# Patient Record
Sex: Male | Born: 1980 | Race: Black or African American | Hispanic: No | Marital: Single | Smoking: Current some day smoker
Health system: Southern US, Community
[De-identification: ages and names within clinical notes are randomized; demographics above are authoritative.]

---

## 2018-11-14 ENCOUNTER — Other Ambulatory Visit: Payer: Self-pay

## 2018-11-14 ENCOUNTER — Encounter: Payer: Self-pay | Admitting: Emergency Medicine

## 2018-11-14 ENCOUNTER — Emergency Department
Admission: EM | Admit: 2018-11-14 | Discharge: 2018-11-14 | Disposition: A | Discharge status: Home / Self Care | Payer: Self-pay | Attending: Emergency Medicine | Admitting: Emergency Medicine

## 2018-11-14 ENCOUNTER — Emergency Department: Payer: Self-pay

## 2018-11-14 DIAGNOSIS — R05 Cough: Secondary | ICD-10-CM

## 2018-11-14 DIAGNOSIS — B349 Viral infection, unspecified: Secondary | ICD-10-CM | POA: Insufficient documentation

## 2018-11-14 DIAGNOSIS — R059 Cough, unspecified: Secondary | ICD-10-CM

## 2018-11-14 NOTE — ED Provider Notes (Signed)
[facility] Emergency Department Provider Note  ____________________________________________  Time seen: Approximately 4:13 PM  I have reviewed the triage vital signs and the nursing notes.   HISTORY  Chief Complaint Cough and Generalized Body Aches    HPI Barry Norman is a 38 y.o. male presents to the emergency department with rhinorrhea, low-grade fever and generalized body aches that patient states has resolved. No cough and rash.  Patient is trying to go back to work and would like a return to work note.  He did state that he had chills last night.  He denies chest tightness, chest pain or shortness of breath.  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  He has had some mild diarrhea.  No vomiting.  No recent travel outside of the country.  Patient has been taking Tylenol for fever but is attempted no other alleviating measures.        History reviewed. No pertinent past medical history.  There are no active problems to display for this patient.   History reviewed. No pertinent surgical history.  Prior to Admission medications   Not on File    Allergies Patient has no known allergies.  No family history on file.  Social History Social History   Tobacco Use  . Smoking status: Not on file  Substance Use Topics  . Alcohol use: Not on file  . Drug use: Not on file     Review of Systems  Constitutional: Patient has low grade fever.  Eyes: No visual changes. No discharge ENT: Patient has nasal congestion.  Cardiovascular: no chest pain. Respiratory: no cough. No SOB. Gastrointestinal: No abdominal pain.  No nausea, no vomiting.  No diarrhea.  No constipation. Genitourinary: Negative for dysuria. No hematuria  Musculoskeletal: Negative for musculoskeletal pain. Skin: Negative for rash, abrasions, lacerations, ecchymosis. Neurological: Negative for headaches, focal weakness or  numbness.   ____________________________________________   PHYSICAL EXAM:  VITAL SIGNS: ED Triage Vitals  Enc Vitals Group     BP 11/14/18 1528 115/74     Pulse Rate 11/14/18 1528 80     Resp 11/14/18 1528 18     Temp 11/14/18 1528 98.7 F (37.1 C)     Temp Source 11/14/18 1528 Oral     SpO2 11/14/18 1528 97 %     Weight 11/14/18 1529 170 lb (77.1 kg)     Height 11/14/18 1529 6\' 1"  (1.854 m)     Head Circumference --      Peak Flow --      Pain Score 11/14/18 1529 8     Pain Loc --      Pain Edu? --      Excl. in GC? --     Constitutional: Alert and oriented. Patient is lying supine. Eyes: Conjunctivae are normal. PERRL. EOMI. Head: Atraumatic. ENT:      Ears: Tympanic membranes are mildly injected with mild effusion bilaterally.       Nose: No congestion/rhinnorhea.      Mouth/Throat: Mucous membranes are moist. Posterior pharynx is mildly erythematous.  Hematological/Lymphatic/Immunilogical: No cervical lymphadenopathy.  Cardiovascular: Normal rate, regular rhythm. Normal S1 and S2.  Good peripheral circulation. Respiratory: Normal respiratory effort without tachypnea or retractions. Lungs CTAB. Good air entry to the bases with no decreased or absent breath sounds. Gastrointestinal: Bowel sounds 4 quadrants. Soft and nontender to palpation. No guarding or rigidity. No palpable masses. No distention. No CVA tenderness. Musculoskeletal: Full range of motion to all extremities. No gross deformities  appreciated. Neurologic:  Normal speech and language. No gross focal neurologic deficits are appreciated.  Skin:  Skin is warm, dry and intact. No rash noted. Psychiatric: Mood and affect are normal. Speech and behavior are normal. Patient exhibits appropriate insight and judgement.     ____________________________________________   LABS (all labs ordered are listed, but only abnormal results are displayed)  Labs Reviewed - No data to  display ____________________________________________  EKG   ____________________________________________  RADIOLOGY    No results found.  ____________________________________________    PROCEDURES  Procedure(s) performed:    Procedures    Medications - No data to display   ____________________________________________   INITIAL IMPRESSION / ASSESSMENT AND PLAN / ED COURSE  Pertinent labs & imaging results that were available during my care of the patient were reviewed by me and considered in my medical decision making (see chart for details).  Review of the [place] CSRS was performed in accordance of the [facility] prior to dispensing any controlled drugs.           Assessment and plan Viral illness Patient presents to the emergency department with low-grade fever, generalized body aches and mild diarrhea that have since resolved.  Patient reports that he had chills last night.  I advised patient to quarantine himself at home for the next 7 days.  Patient education regarding supportive measures were given.  Patient was not given a return to work note.  Patient was written out of work for the next 7 days.  All patient questions were answered.    ____________________________________________  FINAL CLINICAL IMPRESSION(S) / ED DIAGNOSES  Final diagnoses:  Viral illness      NEW MEDICATIONS STARTED DURING THIS VISIT:  ED Discharge Orders    None          This chart was dictated using voice recognition software/Dragon. Despite best efforts to proofread, errors can occur which can change the meaning. Any change was purely unintentional.    Orvil Feil, PA-C 11/14/18 1624    Phineas Semen, MD 11/14/18 1710

## 2018-11-14 NOTE — ED Triage Notes (Signed)
Cough and generalized body aches x 2 days. Denies having had the flu vaccine.

## 2019-01-01 ENCOUNTER — Encounter: Payer: Self-pay | Admitting: Emergency Medicine

## 2019-01-01 ENCOUNTER — Emergency Department
Admission: EM | Admit: 2019-01-01 | Discharge: 2019-01-02 | Payer: Self-pay | Attending: Emergency Medicine | Admitting: Emergency Medicine

## 2019-01-01 ENCOUNTER — Emergency Department: Payer: Self-pay

## 2019-01-01 DIAGNOSIS — S41131A Puncture wound without foreign body of right upper arm, initial encounter: Secondary | ICD-10-CM | POA: Insufficient documentation

## 2019-01-01 DIAGNOSIS — S41111A Laceration without foreign body of right upper arm, initial encounter: Secondary | ICD-10-CM

## 2019-01-01 DIAGNOSIS — Y999 Unspecified external cause status: Secondary | ICD-10-CM | POA: Insufficient documentation

## 2019-01-01 DIAGNOSIS — S21331A Puncture wound without foreign body of right front wall of thorax with penetration into thoracic cavity, initial encounter: Secondary | ICD-10-CM | POA: Insufficient documentation

## 2019-01-01 DIAGNOSIS — F17200 Nicotine dependence, unspecified, uncomplicated: Secondary | ICD-10-CM | POA: Insufficient documentation

## 2019-01-01 DIAGNOSIS — S21111A Laceration without foreign body of right front wall of thorax without penetration into thoracic cavity, initial encounter: Secondary | ICD-10-CM

## 2019-01-01 DIAGNOSIS — Y929 Unspecified place or not applicable: Secondary | ICD-10-CM | POA: Insufficient documentation

## 2019-01-01 DIAGNOSIS — Z23 Encounter for immunization: Secondary | ICD-10-CM | POA: Insufficient documentation

## 2019-01-01 DIAGNOSIS — S270XXA Traumatic pneumothorax, initial encounter: Secondary | ICD-10-CM | POA: Insufficient documentation

## 2019-01-01 DIAGNOSIS — Y939 Activity, unspecified: Secondary | ICD-10-CM | POA: Insufficient documentation

## 2019-01-01 MED ORDER — SODIUM CHLORIDE 0.9 % IV BOLUS
1000.0000 mL | Freq: Once | INTRAVENOUS | Status: AC
  Administered 2019-01-02: 1000 mL via INTRAVENOUS

## 2019-01-01 MED ORDER — MORPHINE SULFATE (PF) 4 MG/ML IV SOLN
4.0000 mg | Freq: Once | INTRAVENOUS | Status: AC
  Administered 2019-01-02: 4 mg via INTRAVENOUS
  Filled 2019-01-01: qty 1

## 2019-01-01 MED ORDER — ONDANSETRON HCL 4 MG/2ML IJ SOLN
4.0000 mg | Freq: Once | INTRAMUSCULAR | Status: AC
  Administered 2019-01-02: 4 mg via INTRAVENOUS
  Filled 2019-01-01: qty 2
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
## 2019-01-01 NOTE — ED Notes (Signed)
X-ray at bedside

## 2019-01-02 ENCOUNTER — Emergency Department: Payer: Self-pay

## 2019-01-02 ENCOUNTER — Encounter: Payer: Self-pay | Admitting: Radiology

## 2019-01-02 ENCOUNTER — Ambulatory Visit (HOSPITAL_COMMUNITY)
Admission: AD | Admit: 2019-01-02 | Discharge: 2019-01-02 | Disposition: A | Discharge status: Home / Self Care | Payer: Self-pay | Source: Other Acute Inpatient Hospital | Attending: Emergency Medicine | Admitting: Emergency Medicine

## 2019-01-02 DIAGNOSIS — T1490XA Injury, unspecified, initial encounter: Secondary | ICD-10-CM | POA: Insufficient documentation

## 2019-01-02 DIAGNOSIS — X58XXXA Exposure to other specified factors, initial encounter: Secondary | ICD-10-CM | POA: Insufficient documentation

## 2019-01-02 LAB — PROTIME-INR
INR: 1 (ref 0.8–1.2)
Prothrombin Time: 12.9 seconds (ref 11.4–15.2)

## 2019-01-02 LAB — COMPREHENSIVE METABOLIC PANEL
ALT: 11 U/L (ref 0–44)
AST: 16 U/L (ref 15–41)
Albumin: 4.4 g/dL (ref 3.5–5.0)
Alkaline Phosphatase: 62 U/L (ref 38–126)
Anion gap: 12 (ref 5–15)
BUN: 17 mg/dL (ref 6–20)
CO2: 22 mmol/L (ref 22–32)
Calcium: 8.9 mg/dL (ref 8.9–10.3)
Chloride: 108 mmol/L (ref 98–111)
Creatinine, Ser: 1.31 mg/dL — ABNORMAL HIGH (ref 0.61–1.24)
GFR calc Af Amer: >60 mL/min (ref >60)
GFR calc non Af Amer: >60 mL/min (ref >60)
Glucose, Bld: 111 mg/dL — ABNORMAL HIGH (ref 70–99)
Potassium: 3.2 mmol/L — ABNORMAL LOW (ref 3.5–5.1)
Sodium: 142 mmol/L (ref 135–145)
Total Bilirubin: 0.4 mg/dL (ref 0.3–1.2)
Total Protein: 7.8 g/dL (ref 6.5–8.1)

## 2019-01-02 LAB — TYPE AND SCREEN
ABO/RH(D): A POS
Antibody Screen: NEGATIVE

## 2019-01-02 LAB — CBC
HCT: 41.5 % (ref 39.0–52.0)
Hemoglobin: 13.7 g/dL (ref 13.0–17.0)
MCH: 23.5 pg — ABNORMAL LOW (ref 26.0–34.0)
MCHC: 33 g/dL (ref 30.0–36.0)
MCV: 71.3 fL — ABNORMAL LOW (ref 80.0–100.0)
Platelets: 303 10*3/uL (ref 150–400)
RBC: 5.82 MIL/uL — ABNORMAL HIGH (ref 4.22–5.81)
RDW: 16.5 % — ABNORMAL HIGH (ref 11.5–15.5)
WBC: 5.5 10*3/uL (ref 4.0–10.5)
nRBC: 0 % (ref 0.0–0.2)

## 2019-01-02 MED ORDER — LIDOCAINE HCL (PF) 1 % IJ SOLN
INTRAMUSCULAR | Status: AC
  Administered 2019-01-02: 5 mL via INTRADERMAL
  Filled 2019-01-02: qty 10

## 2019-01-02 MED ORDER — ETOMIDATE 2 MG/ML IV SOLN
INTRAVENOUS | Status: AC
  Administered 2019-01-02: 10 mg via INTRAVENOUS
  Filled 2019-01-02: qty 10

## 2019-01-02 MED ORDER — ETOMIDATE 2 MG/ML IV SOLN
5.0000 mg | Freq: Once | INTRAVENOUS | Status: AC
  Administered 2019-01-02: 5 mg via INTRAVENOUS

## 2019-01-02 MED ORDER — IOHEXOL 300 MG/ML  SOLN
75.0000 mL | Freq: Once | INTRAMUSCULAR | Status: AC | PRN
  Administered 2019-01-02: 75 mL via INTRAVENOUS

## 2019-01-02 MED ORDER — MORPHINE SULFATE (PF) 4 MG/ML IV SOLN
4.0000 mg | Freq: Once | INTRAVENOUS | Status: AC
  Administered 2019-01-02: 4 mg via INTRAVENOUS
  Filled 2019-01-02: qty 1

## 2019-01-02 MED ORDER — LIDOCAINE HCL (PF) 1 % IJ SOLN
5.0000 mL | Freq: Once | INTRAMUSCULAR | Status: AC
  Administered 2019-01-02: 5 mL via INTRADERMAL

## 2019-01-02 MED ORDER — ETOMIDATE 2 MG/ML IV SOLN
10.0000 mg | Freq: Once | INTRAVENOUS | Status: AC
  Administered 2019-01-02: 10 mg via INTRAVENOUS

## 2019-01-02 MED ORDER — TETANUS-DIPHTH-ACELL PERTUSSIS 5-2.5-18.5 LF-MCG/0.5 IM SUSP
0.5000 mL | Freq: Once | INTRAMUSCULAR | Status: AC
  Administered 2019-01-02: 0.5 mL via INTRAMUSCULAR
  Filled 2019-01-02: qty 0.5

## 2019-01-02 NOTE — ED Notes (Signed)
Time out performed

## 2019-01-02 NOTE — ED Notes (Addendum)
Chest tube incision made. Sterile procedure performed/observed.
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
## 2019-01-02 NOTE — ED Notes (Signed)
Time out performed at 00:29. Witnessed by this RN, April RN, and MD [facility].

## 2019-01-02 NOTE — ED Notes (Addendum)
Patient transported to CT 

## 2019-01-02 NOTE — ED Notes (Signed)
Assisted MD with chest tube insertion.

## 2019-01-02 NOTE — ED Notes (Addendum)
[facility], Tammi, report given

## 2019-01-02 NOTE — ED Notes (Signed)
Consent signed at 00:26.

## 2019-01-02 NOTE — ED Notes (Signed)
[employer] officer at bedside 

## 2019-01-02 NOTE — ED Notes (Signed)
Chest tube set up at bedside, lidocaine at bedside.

## 2019-01-02 NOTE — ED Notes (Signed)
Patient returned from CT

## 2019-01-02 NOTE — ED Notes (Signed)
Chest tube inserted. MD suturing tube in place.
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
## 2022-01-02 ENCOUNTER — Emergency Department
Admission: EM | Admit: 2022-01-02 | Discharge: 2022-01-02 | Disposition: A | Discharge status: Home / Self Care | Payer: Self-pay | Attending: Emergency Medicine | Admitting: Emergency Medicine

## 2022-01-02 ENCOUNTER — Other Ambulatory Visit: Payer: Self-pay

## 2022-01-02 ENCOUNTER — Encounter: Payer: Self-pay | Admitting: Emergency Medicine

## 2022-01-02 DIAGNOSIS — H16012 Central corneal ulcer, left eye: Secondary | ICD-10-CM | POA: Insufficient documentation

## 2022-01-02 DIAGNOSIS — H16003 Unspecified corneal ulcer, bilateral: Secondary | ICD-10-CM

## 2022-01-02 MED ORDER — MOXIFLOXACIN HCL 0.5 % OP SOLN: 1.0000 [drp] | Freq: Three times a day (TID) | OPHTHALMIC | 0 refills | Status: AC

## 2022-01-02 MED ORDER — FLUORESCEIN SODIUM 1 MG OP STRP
1.0000 | ORAL_STRIP | Freq: Once | OPHTHALMIC | Status: AC
Start: 2022-01-02 — End: 2022-01-02
  Administered 2022-01-02: 1 via OPHTHALMIC
  Filled 2022-01-02: qty 1

## 2022-01-02 MED ORDER — TETRACAINE HCL 0.5 % OP SOLN
1.0000 [drp] | Freq: Once | OPHTHALMIC | Status: AC
  Administered 2022-01-02: 1 [drp] via OPHTHALMIC
  Filled 2022-01-02: qty 4

## 2022-01-02 NOTE — ED Triage Notes (Signed)
C/O bilateral eye irritation x 1 week.  Sclera red bilaterally.  States eyes draining and pain when trying to see. ? ?AAOx3.  Skin warm and dry. NAD ?

## 2022-01-02 NOTE — ED Provider Notes (Signed)
? ?[facility] ?Provider Note ? ? ? Event Date/Time  ? First MD Initiated Contact with Patient 01/02/22 1839   ?  (approximate) ? ? ?History  ? ?Eye Pain ? ? ?HPI ? ?Barry Norman is a 42 y.o. male with no significant past medical history presents with eye pain and redness.  Symptoms been going on for about a week.  He endorses significant burning pain and foreign body sensation.  Denies any injury or exposure.  He notes that there has been discharge at the corner of his eyes that build when he is sleeping eyes.  Did not have blurred vision right worse than left no contacts or glasses.  No prior ocular surgeries.  Denies any urinary symptoms penile discharge rashes etc.  No fevers.  Did have somewhat of a runny nose this morning but otherwise no infectious symptoms. ?  ? ?History reviewed. No pertinent past medical history. ? ?There are no problems to display for this patient. ? ? ? ?Physical Exam  ?Triage Vital Signs: ?ED Triage Vitals  ?Enc Vitals Group  ?   BP 01/02/22 1824 111/66  ?   Pulse Rate 01/02/22 1824 80  ?   Resp 01/02/22 1824 16  ?   Temp 01/02/22 1824 98.2 ?F (36.8 ?C)  ?   Temp Source 01/02/22 1824 Oral  ?   SpO2 01/02/22 1824 95 %  ?   Weight 01/02/22 1822 190 lb 0.6 oz (86.2 kg)  ?   Height 01/02/22 1822 6\' 1"  (1.854 m)  ?   Head Circumference --   ?   Peak Flow --   ?   Pain Score 01/02/22 1822 7  ?   Pain Loc --   ?   Pain Edu? --   ?   Excl. in GC? --   ? ? ?Most recent vital signs: ?Vitals:  ? 01/02/22 1824  ?BP: 111/66  ?Pulse: 80  ?Resp: 16  ?Temp: 98.2 ?F (36.8 ?C)  ?SpO2: 95%  ? ? ? ?General: Awake, no distress.  ?CV:  Good peripheral perfusion.  ?Resp:  Normal effort.  ?Abd:  No distention.  ?Neuro:             Awake, Alert, Oriented x 3  ?Other:  Bilateral conjunctiva are injected, green discharge at the medial canthus ?Pupils are equal reactive extraocular movements intact ?Visual acuity R 20/40, L 20/30  ?Small opaque circular corneal ulcer at the 12 o'clock position  bilateral eyes, small ulceration at 6:00 on the right eye ?No dendrites negative Seidel sign ?IOP Right 7 L 6 ? ? ?ED Results / Procedures / Treatments  ?Labs ?(all labs ordered are listed, but only abnormal results are displayed) ?Labs Reviewed - No data to display ? ? ?EKG ? ? ? ? ?RADIOLOGY ? ? ? ?PROCEDURES: ? ?Critical Care performed: No ? ?Procedures ? ? ? ?MEDICATIONS ORDERED IN ED: ?Medications  ?fluorescein ophthalmic strip 1 strip (1 strip Both Eyes Given by Other 01/02/22 1855)  ?tetracaine (PONTOCAINE) 0.5 % ophthalmic solution 1-2 drop (1 drop Both Eyes Given by Other 01/02/22 1855)  ? ? ? ?IMPRESSION / MDM / ASSESSMENT AND PLAN / ED COURSE  ?I reviewed the triage vital signs and the nursing notes. ?             ?               ? ?Differential diagnosis includes, but is not limited to, corneal abrasion, corneal ulcer, conjunctivitis, keratitis,  pleuritis, uveitis ? ?Patient is a 41 year old male presents with bilateral eye redness pain discharge x1 week.  Complains of blurred vision right greater than left.  Does not wear contacts no STD risk factors or other symptoms.  On exam he has fairly significant bilateral conjunctival injection with green discharge of bilateral medial canthi as well as bilateral corneal ulcers at the 12:00 positions in both eyes with another at the 6 o'clock position on the right eye.  Visual acuity 20/30 in 2040.  Discussed with Dr. Estelle Grumbles with ophthalmology who recommends moxifloxacin eyedrops 1 in each eye every hour while he is awake.  They will see the patient in office tomorrow.  Discussed this with the patient importance of filling the prescription tonight so he can start the antibiotics. ? ?  ? ? ?FINAL CLINICAL IMPRESSION(S) / ED DIAGNOSES  ? ?Final diagnoses:  ?Corneal ulcer of both eyes  ? ? ? ?Rx / DC Orders  ? ?ED Discharge Orders   ? ?      Ordered  ?  moxifloxacin (VIGAMOX) 0.5 % ophthalmic solution  3 times daily       ? 01/02/22 1915  ? ?  ?  ? ?  ? ? ? ?Note:   This document was prepared using Dragon voice recognition software and may include unintentional dictation errors. ?  ?Georga Hacking, MD ?01/02/22 1919 ? ?

## 2022-01-02 NOTE — Discharge Instructions (Addendum)
You have an infection in your eye which is causing ulceration of your cornea.  Please use the antibiotic drops.  You will place a drop in each eye every hour while you are awake.  Please follow-up with the ophthalmologist tomorrow. ? ?Please call first thing in the morning to see when they have an appointment for you. ?
